# Patient Record
Sex: Male | Born: 1975 | Race: White | Hispanic: No | Marital: Married | Smoking: Never smoker
Health system: Southern US, Community
[De-identification: ages and names within clinical notes are randomized; demographics above are authoritative.]

---

## 1999-02-21 ENCOUNTER — Emergency Department (HOSPITAL_COMMUNITY): Admission: EM | Admit: 1999-02-21 | Discharge: 1999-02-21 | Payer: Self-pay | Admitting: Emergency Medicine

## 2008-09-30 ENCOUNTER — Emergency Department (HOSPITAL_BASED_OUTPATIENT_CLINIC_OR_DEPARTMENT_OTHER): Admission: EM | Admit: 2008-09-30 | Discharge: 2008-09-30 | Payer: Self-pay | Admitting: Emergency Medicine

## 2011-01-12 LAB — RAPID STREP SCREEN (MED CTR MEBANE ONLY): Streptococcus, Group A Screen (Direct): NEGATIVE

## 2020-12-04 ENCOUNTER — Other Ambulatory Visit: Payer: Self-pay

## 2020-12-04 ENCOUNTER — Ambulatory Visit (INDEPENDENT_AMBULATORY_CARE_PROVIDER_SITE_OTHER): Payer: Managed Care, Other (non HMO) | Admitting: Family Medicine

## 2020-12-04 DIAGNOSIS — M25511 Pain in right shoulder: Secondary | ICD-10-CM | POA: Diagnosis not present

## 2020-12-04 NOTE — Progress Notes (Signed)
   Office Visit Note   Patient: Carlos Simmons           Date of Birth: 1976/09/07           MRN: [id number] Visit Date: 12/04/2020 Requested by: No referring provider defined for this encounter. PCP: No primary care provider on file.  Subjective: Chief Complaint  Patient presents with  . Right Shoulder - Pain    Mild to moderate pain that is continuous that will go away from time to time, ROM is fine but hurts with certain motions/positions.    HPI: He is here with right shoulder pain.  He is right-hand dominant, does not recall any injury.  About 2 months ago he started noticing intermittent pain in the lateral shoulder with certain movements such as reaching across his body to grab the seatbelt.  Pain is not severe, but since it has not gone away he felt he should come in to be evaluated.  No previous problems with his shoulder.  His work involves a lot of lifting and repetitive activities.  He has not taken medications for this, does not like to take medicines unless he has to.               ROS:   All other systems were reviewed and are negative.  Objective: Vital Signs: There were no vitals taken for this visit.  Physical Exam:  General:  Alert and oriented, in no acute distress. Pulm:  Breathing unlabored. Psy:  Normal mood, congruent affect.  Right shoulder: He has full active range of motion with no adhesive capsulitis.  He has no tenderness over the [facility] joint but he does have pain with AC crossover test although does not seem to localize to the [facility] joint.  Isometric rotator cuff strength is 5/5 throughout.  He has mild pain with external rotation against resistance.  There is pain with passive abduction and external rotation as well as some crepitus in the shoulder.   Imaging: No results found.  Assessment & Plan: 1.  Right shoulder pain, suspect impingement -Discussed options with him and elected to try home exercises.  If he fails to improve we could add  anti-inflammatories and possibly physical therapy versus a one-time subacromial injection.     Procedures: No procedures performed        PMFS History: There are no problems to display for this patient.  No past medical history on file.  No family history on file.   Social History   Occupational History  . Not on file  Tobacco Use  . Smoking status: Not on file  . Smokeless tobacco: Not on file  Substance and Sexual Activity  . Alcohol use: Not on file  . Drug use: Not on file  . Sexual activity: Not on file

## 2021-02-20 ENCOUNTER — Emergency Department (HOSPITAL_BASED_OUTPATIENT_CLINIC_OR_DEPARTMENT_OTHER): Payer: Managed Care, Other (non HMO)

## 2021-02-20 ENCOUNTER — Emergency Department (HOSPITAL_BASED_OUTPATIENT_CLINIC_OR_DEPARTMENT_OTHER)
Admission: EM | Admit: 2021-02-20 | Discharge: 2021-02-20 | Disposition: A | Discharge status: Home / Self Care | Payer: Managed Care, Other (non HMO) | Attending: Emergency Medicine | Admitting: Emergency Medicine

## 2021-02-20 ENCOUNTER — Other Ambulatory Visit: Payer: Self-pay

## 2021-02-20 ENCOUNTER — Encounter (HOSPITAL_BASED_OUTPATIENT_CLINIC_OR_DEPARTMENT_OTHER): Payer: Self-pay | Admitting: *Deleted

## 2021-02-20 DIAGNOSIS — F17220 Nicotine dependence, chewing tobacco, uncomplicated: Secondary | ICD-10-CM | POA: Insufficient documentation

## 2021-02-20 DIAGNOSIS — R002 Palpitations: Secondary | ICD-10-CM

## 2021-02-20 LAB — COMPREHENSIVE METABOLIC PANEL
ALT: 107 U/L — ABNORMAL HIGH (ref 0–44)
AST: 60 U/L — ABNORMAL HIGH (ref 15–41)
Albumin: 4.2 g/dL (ref 3.5–5.0)
Alkaline Phosphatase: 64 U/L (ref 38–126)
Anion gap: 7 (ref 5–15)
BUN: 14 mg/dL (ref 6–20)
CO2: 25 mmol/L (ref 22–32)
Calcium: 8.4 mg/dL — ABNORMAL LOW (ref 8.9–10.3)
Chloride: 101 mmol/L (ref 98–111)
Creatinine, Ser: 0.87 mg/dL (ref 0.61–1.24)
GFR, Estimated: >60 mL/min (ref >60)
Glucose, Bld: 106 mg/dL — ABNORMAL HIGH (ref 70–99)
Potassium: 3.5 mmol/L (ref 3.5–5.1)
Sodium: 133 mmol/L — ABNORMAL LOW (ref 135–145)
Total Bilirubin: 0.8 mg/dL (ref 0.3–1.2)
Total Protein: 7.7 g/dL (ref 6.5–8.1)

## 2021-02-20 LAB — CBC WITH DIFFERENTIAL/PLATELET
Abs Immature Granulocytes: 0.05 10*3/uL (ref 0.00–0.07)
Basophils Absolute: 0.1 10*3/uL (ref 0.0–0.1)
Basophils Relative: 1 %
Eosinophils Absolute: 0.1 10*3/uL (ref 0.0–0.5)
Eosinophils Relative: 1 %
Hemoglobin: 17.1 g/dL — ABNORMAL HIGH (ref 13.0–17.0)
Immature Granulocytes: 0 %
Lymphocytes Relative: 18 %
Lymphs Abs: 2.1 10*3/uL (ref 0.7–4.0)
Monocytes Absolute: 1 10*3/uL (ref 0.1–1.0)
Monocytes Relative: 9 %
Neutro Abs: 8.3 10*3/uL — ABNORMAL HIGH (ref 1.7–7.7)
Neutrophils Relative %: 71 %
Platelets: 248 10*3/uL (ref 150–400)
Smear Review: NORMAL
WBC: 11.6 10*3/uL — ABNORMAL HIGH (ref 4.0–10.5)
nRBC: 0 % (ref 0.0–0.2)

## 2021-02-20 LAB — TROPONIN I (HIGH SENSITIVITY)
Troponin I (High Sensitivity): <2 ng/L (ref <18)
Troponin I (High Sensitivity): 2 ng/L (ref <18)

## 2021-02-20 MED ORDER — SODIUM CHLORIDE 0.9 % IV BOLUS
1000.0000 mL | Freq: Once | INTRAVENOUS | Status: AC
  Administered 2021-02-20: 1000 mL via INTRAVENOUS

## 2021-02-20 NOTE — Discharge Instructions (Signed)
Your work-up in the ED is reassuring.  Your heart enzyme tests in particular were normal  If you have persistent palpitations, follow-up with a heart doctor.  You may need a heart monitor if you have persistent palpitations  Return to ER if you have worse palpitation, chest pain, trouble breathing

## 2021-02-20 NOTE — ED Notes (Signed)
Patient transported to X-ray 

## 2021-02-20 NOTE — ED Provider Notes (Signed)
[facility] EMERGENCY DEPARTMENT Provider Note   CSN: [id number] Arrival date & time: 02/20/21  1822     History Chief Complaint  Patient presents with  . Palpitations    Carlos Simmons is a 45 y.o. male who presented with palpitations.  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  He states that lasted about 10 minutes.  He denies any chest pressure.  He states that it went away by itself.  Denies any blood clots.  He states that he feels shaky at that time.  Denies any heart disease or arrhythmias.   The history is provided by the patient.       History reviewed. No pertinent past medical history.  There are no problems to display for this patient.   History reviewed. No pertinent surgical history.     No family history on file.  Social History   Tobacco Use  . Smoking status: Never Smoker  . Smokeless tobacco: Current User    Types: Snuff  Substance Use Topics  . Alcohol use: Never  . Drug use: Never    Home Medications Prior to Admission medications   Not on File    Allergies    Patient has no known allergies.  Review of Systems   Review of Systems  Cardiovascular: Positive for palpitations.  All other systems reviewed and are negative.   Physical Exam Updated Vital Signs BP 116/83   Pulse 85   Temp 98.8 F (37.1 C) (Oral)   Resp 17   Ht 5\' 11"  (1.803 m)   Wt 95.3 kg   SpO2 91%   BMI 29.29 kg/m   Physical Exam Vitals and nursing note reviewed.  Constitutional:      Appearance: Normal appearance.  HENT:     Head: Normocephalic.     Mouth/Throat:     Mouth: Mucous membranes are moist.  Eyes:     Extraocular Movements: Extraocular movements intact.     Pupils: Pupils are equal, round, and reactive to light.  Cardiovascular:     Rate and Rhythm: Normal rate and regular rhythm.     Pulses: Normal pulses.     Heart sounds: Normal heart sounds.  Pulmonary:      Effort: Pulmonary effort is normal.     Breath sounds: Normal breath sounds.  Abdominal:     General: Abdomen is flat.     Palpations: Abdomen is soft.  Musculoskeletal:        General: Normal range of motion.     Cervical back: Normal range of motion and neck supple.  Skin:    General: Skin is warm.     Capillary Refill: Capillary refill takes less than 2 seconds.  Neurological:     General: No focal deficit present.     Mental Status: He is alert.  Psychiatric:        Mood and Affect: Mood normal.        Behavior: Behavior normal.     ED Results / Procedures / Treatments   Labs (all labs ordered are listed, but only abnormal results are displayed) Labs Reviewed  CBC WITH DIFFERENTIAL/PLATELET - Abnormal; Notable for the following components:      Result Value   WBC 11.6 (*)    Hemoglobin 17.1 (*)    Neutro Abs 8.3 (*)    All other components within normal limits  COMPREHENSIVE METABOLIC PANEL - Abnormal; Notable for the following components:  Sodium 133 (*)    Glucose, Bld 106 (*)    Calcium 8.4 (*)    AST 60 (*)    ALT 107 (*)    All other components within normal limits  TROPONIN I (HIGH SENSITIVITY)  TROPONIN I (HIGH SENSITIVITY)    EKG EKG Interpretation  Date/Time:  Thursday Feb 20 2021 18:30:53 EDT Ventricular Rate:  87 PR Interval:  130 QRS Duration: 92 QT Interval:  360 QTC Calculation: 433 R Axis:   74 Text Interpretation: Normal sinus rhythm Normal ECG No significant change since last tracing Confirmed by Richardean Canal [phone]) on 02/20/2021 6:56:10 PM   Radiology DG Chest 2 View  Result Date: 02/20/2021 CLINICAL DATA:  Palpitation EXAM: CHEST - 2 VIEW COMPARISON:  None. FINDINGS: The heart size and mediastinal contours are within normal limits. Both lungs are clear. The visualized skeletal structures are unremarkable. IMPRESSION: No active cardiopulmonary disease. Electronically Signed   By: Jasmine Pang M.D.   On: 02/20/2021 19:18     Procedures Procedures   Medications Ordered in ED Medications  sodium chloride 0.9 % bolus 1,000 mL (0 mLs Intravenous Stopped 02/20/21 2125)    ED Course  I have reviewed the triage vital signs and the nursing notes.  Pertinent labs & imaging results that were available during my care of the patient were reviewed by me and considered in my medical decision making (see chart for details).    MDM Rules/Calculators/A&P                         Nichlos Kunzler is a 45 y.o. male here presenting with palpitations.  Palpitations for about 10 minutes today.  Consider dehydration causing palpitations versus arrhythmias.  Patient is in sinus rhythm right now.  Plan to check CBC and CMP and troponin and chest x-ray.  Will hydrate and reassess  10:15 PM Patient's troponin is negative x2.  Will refer to cardiology for Holter monitor if he has persistent palpitations.  Stable for discharge.  Final Clinical Impression(s) / ED Diagnoses Final diagnoses:  None    Rx / DC Orders ED Discharge Orders    None       Charlynne Pander, MD 02/20/21 2216

## 2021-02-20 NOTE — ED Notes (Signed)
Denies CP or shortness of breath.  Just felt like heart was racing.

## 2021-02-20 NOTE — ED Triage Notes (Signed)
Palpitations for about an hour. No pain. He feels shaky. He had soreness in his left arm during the palpitations.

## 2021-08-20 ENCOUNTER — Other Ambulatory Visit: Payer: Self-pay

## 2021-08-20 ENCOUNTER — Encounter: Payer: Self-pay | Admitting: Cardiology

## 2021-08-20 ENCOUNTER — Ambulatory Visit (INDEPENDENT_AMBULATORY_CARE_PROVIDER_SITE_OTHER): Payer: 59

## 2021-08-20 ENCOUNTER — Ambulatory Visit: Payer: 59 | Admitting: Cardiology

## 2021-08-20 VITALS — BP 130/80 | HR 80 | Ht 71.0 in | Wt 222.2 lb

## 2021-08-20 DIAGNOSIS — R002 Palpitations: Secondary | ICD-10-CM | POA: Diagnosis not present

## 2021-08-20 DIAGNOSIS — Z79899 Other long term (current) drug therapy: Secondary | ICD-10-CM

## 2021-08-20 DIAGNOSIS — E669 Obesity, unspecified: Secondary | ICD-10-CM

## 2021-08-20 DIAGNOSIS — Z719 Counseling, unspecified: Secondary | ICD-10-CM | POA: Diagnosis not present

## 2021-08-20 NOTE — Progress Notes (Signed)
Cardiology Office Note:    Date:  08/22/2021   ID:  Carlos Simmons, DOB 08/03/1976, MRN [id number]  PCP:  Pcp, No  Cardiologist:  Thomasene Ripple, DO  Electrophysiologist:  None   Referring MD: No ref. provider found   " My heart keeps fluttering at times"  History of Present Illness:    Carlos Simmons is a 45 y.o. male with no reported medical history who presents to be evaluated for palpitations.  The patient tells me that recently he has had some intermittent palpitations.  He describes it at times as a fluttering sensation.  Sometimes his abrupt heartbeat which cannot last for few minutes and resolves.  It comes and goes.  Back in May the patient had had these episodes and they were frequent.  He ended up at the emergency department for evaluation of this.  At which time his troponins were negative the palpitations had resolved and his work-up was essentially negative.  He tells me he was noted to be dehydrated and was also hyponatremic.  He was asked to follow-up with cardiology.  Today he notes that the episodes are no longer frequent but he still experiences them.  No past medical history on file.  No past surgical history on file.  Current Medications: Current Meds  Medication Sig   aspirin 325 MG tablet Take 325 mg by mouth daily.     Allergies:   Patient has no known allergies.   Social History   Socioeconomic History   Marital status: Married    Spouse name: Not on file   Number of children: Not on file   Years of education: Not on file   Highest education level: Not on file  Occupational History   Not on file  Tobacco Use   Smoking status: Never   Smokeless tobacco: Current    Types: Snuff  Substance and Sexual Activity   Alcohol use: Never   Drug use: Never   Sexual activity: Not on file  Other Topics Concern   Not on file  Social History Narrative   Not on file   Social Determinants of Health   Financial Resource Strain: Not on file  Food  Insecurity: Not on file  Transportation Needs: Not on file  Physical Activity: Not on file  Stress: Not on file  Social Connections: Not on file     Family History: The patient's family history is not on file.  ROS:   Review of Systems  Constitution: Negative for decreased appetite, fever and weight gain.  HENT: Negative for congestion, ear discharge, hoarse voice and sore throat.   Eyes: Negative for discharge, redness, vision loss in right eye and visual halos.  Cardiovascular: Reports palpitations.  Negative for chest pain, dyspnea on exertion, leg swelling, orthopnea.  Respiratory: Negative for cough, hemoptysis, shortness of breath and snoring.   Endocrine: Negative for heat intolerance and polyphagia.  Hematologic/Lymphatic: Negative for bleeding problem. Does not bruise/bleed easily.  Skin: Negative for flushing, nail changes, rash and suspicious lesions.  Musculoskeletal: Negative for arthritis, joint pain, muscle cramps, myalgias, neck pain and stiffness.  Gastrointestinal: Negative for abdominal pain, bowel incontinence, diarrhea and excessive appetite.  Genitourinary: Negative for decreased libido, genital sores and incomplete emptying.  Neurological: Negative for brief paralysis, focal weakness, headaches and loss of balance.  Psychiatric/Behavioral: Negative for altered mental status, depression and suicidal ideas.  Allergic/Immunologic: Negative for HIV exposure and persistent infections.    EKGs/Labs/Other Studies Reviewed:    The following studies were reviewed  today:   EKG:  The ekg ordered today demonstrates sinus rhythm, heart rate 80 bpm.  Recent Labs: 02/20/2021: ALT 107 08/20/2021: BUN 12; Creatinine, Ser 0.92; Hemoglobin 17.2; Magnesium 2.1; Platelets 273; Potassium 4.3; Sodium 139; TSH 1.200  Recent Lipid Panel No results found for: CHOL, TRIG, HDL, CHOLHDL, VLDL, LDLCALC, LDLDIRECT  Physical Exam:    VS:  BP 130/80 (BP Location: Left Arm)   Pulse 80    Ht 5\' 11"  (1.803 m)   Wt 222 lb 3.2 oz (100.8 kg)   SpO2 98%   BMI 30.99 kg/m     Wt Readings from Last 3 Encounters:  08/20/21 222 lb 3.2 oz (100.8 kg)  02/20/21 210 lb (95.3 kg)     GEN: Well nourished, well developed in no acute distress HEENT: Normal NECK: No JVD; No carotid bruits LYMPHATICS: No lymphadenopathy CARDIAC: S1S2 noted,RRR, no murmurs, rubs, gallops RESPIRATORY:  Clear to auscultation without rales, wheezing or rhonchi  ABDOMEN: Soft, non-tender, non-distended, +bowel sounds, no guarding. EXTREMITIES: No edema, No cyanosis, no clubbing MUSCULOSKELETAL:  No deformity  SKIN: Warm and dry NEUROLOGIC:  Alert and oriented x 3, non-focal PSYCHIATRIC:  Normal affect, good insight  ASSESSMENT:    1. Palpitations   2. Health education/counseling   3. Obesity (BMI 30-39.9)    PLAN:     I would like to rule out a cardiovascular etiology of this palpitation, therefore at this time I would like to placed a zio patch for  14 days.  We will also get blood work for TSH, repeat BMP and mag. Once these testing have been performed amd reviewed further reccomendations will be made. For now, I do reccomend that the patient goes to the nearest ED if  symptoms recur.  Overall health education performed.  Risk factors for cardiovascular disease also assessed.  The patient understands the need to lose weight with diet and exercise. We have discussed specific strategies for this.   The patient is in agreement with the above plan. The patient left the office in stable condition.  The patient will follow up as needed if monitor is normal.   Medication Adjustments/Labs and Tests Ordered: Current medicines are reviewed at length with the patient today.  Concerns regarding medicines are outlined above.  Orders Placed This Encounter  Procedures   Basic Metabolic Panel (BMET)   Magnesium   CBC with Differential/Platelet   TSH+T4F+T3Free   LONG TERM MONITOR (3-14 DAYS)   EKG  12-Lead    No orders of the defined types were placed in this encounter.   Patient Instructions  Medication Instructions:  Your physician recommends that you continue on your current medications as directed. Please refer to the Current Medication list given to you today.  *If you need a refill on your cardiac medications before your next appointment, please call your pharmacy*   Lab Work: Your physician recommends that you return for lab work in:  TODAY: BMET, Mag, CBC, [phone] If you have labs (blood work) drawn today and your tests are completely normal, you will receive your results only by: MyChart Message (if you have MyChart) OR A paper copy in the mail If you have any lab test that is abnormal or we need to change your treatment, we will call you to review the results.   Testing/Procedures: Christena Deem- Long Term Monitor Instructions  Your physician has requested you wear a ZIO patch monitor for 14 days.  This is a single patch monitor. [employer] supplies one patch monitor  per enrollment. Additional stickers are not available. Please do not apply patch if you will be having a Nuclear Stress Test,  Echocardiogram, Cardiac CT, MRI, or Chest Xray during the period you would be wearing the  monitor. The patch cannot be worn during these tests. You cannot remove and re-apply the  ZIO XT patch monitor.  Your ZIO patch monitor will be mailed 3 day USPS to your address on file. It may take 3-5 days  to receive your monitor after you have been enrolled.  Once you have received your monitor, please review the enclosed instructions. Your monitor  has already been registered assigning a specific monitor serial # to you.  Billing and Patient Assistance Program Information  We have supplied [employer] with any of your insurance information on file for billing purposes. [employer] offers a sliding scale Patient Assistance Program for patients that do not have  insurance, or whose insurance does  not completely cover the cost of the ZIO monitor.  You must apply for the Patient Assistance Program to qualify for this discounted rate.  To apply, please call [employer] at [phone], select option 4, select option 2, ask to apply for  Patient Assistance Program. Meredeth Ide will ask your household income, and how many people  are in your household. They will quote your out-of-pocket cost based on that information.  [employer] will also be able to set up a 60-month, interest-free payment plan if needed.  Applying the monitor   Shave hair from upper left chest.  Hold abrader disc by orange tab. Rub abrader in 40 strokes over the upper left chest as  indicated in your monitor instructions.  Clean area with 4 enclosed alcohol pads. Let dry.  Apply patch as indicated in monitor instructions. Patch will be placed under collarbone on left  side of chest with arrow pointing upward.  Rub patch adhesive wings for 2 minutes. Remove white label marked "1". Remove the white  label marked "2". Rub patch adhesive wings for 2 additional minutes.  While looking in a mirror, press and release button in center of patch. A small green light will  flash 3-4 times. This will be your only indicator that the monitor has been turned on.  Do not shower for the first 24 hours. You may shower after the first 24 hours.  Press the button if you feel a symptom. You will hear a small click. Record Date, Time and  Symptom in the Patient Logbook.  When you are ready to remove the patch, follow instructions on the last 2 pages of Patient  Logbook. Stick patch monitor onto the last page of Patient Logbook.  Place Patient Logbook in the blue and white box. Use locking tab on box and tape box closed  securely. The blue and white box has prepaid postage on it. Please place it in the mailbox as  soon as possible. Your physician should have your test results approximately 7 days after the  monitor has been mailed back to [facility].   Call [facility] Customer Care at [phone] if you have questions regarding  your ZIO XT patch monitor. Call them immediately if you see an orange light blinking on your  monitor.  If your monitor falls off in less than 4 days, contact our Monitor department at [phone].  If your monitor becomes loose or falls off after 4 days call [employer] at [phone] for  suggestions on securing your monitor   Follow-Up: At [facility], you and your health needs  are our priority.  As part of our continuing mission to provide you with exceptional heart care, we have created designated Provider Care Teams.  These Care Teams include your primary Cardiologist (physician) and Advanced Practice Providers (APPs -  Physician Assistants and Nurse Practitioners) who all work together to provide you with the care you need, when you need it.  We recommend signing up for the patient portal called "MyChart".  Sign up information is provided on this After Visit Summary.  MyChart is used to connect with patients for Virtual Visits (Telemedicine).  Patients are able to view lab/test results, encounter notes, upcoming appointments, etc.  Non-urgent messages can be sent to your provider as well.   To learn more about what you can do with MyChart, go to [email or web address].    Your next appointment:    As needed if monitor is normal  The format for your next appointment:   In Person  Provider:   Thomasene Ripple, DO     Other Instructions     Adopting a Healthy Lifestyle.  Know what a healthy weight is for you (roughly BMI <25) and aim to maintain this   Aim for 7+ servings of fruits and vegetables daily   65-80+ fluid ounces of water or unsweet tea for healthy kidneys   Limit to max 1 drink of alcohol per day; avoid smoking/tobacco   Limit animal fats in diet for cholesterol and heart health - choose grass fed whenever available   Avoid highly processed foods, and foods high in  saturated/trans fats   Aim for low stress - take time to unwind and care for your mental health   Aim for 150 min of moderate intensity exercise weekly for heart health, and weights twice weekly for bone health   Aim for 7-9 hours of sleep daily   When it comes to diets, agreement about the perfect plan isnt easy to find, even among the experts. Experts at the [facility] [employer] developed an idea known as the Healthy Eating Plate. Just imagine a plate divided into logical, healthy portions.   The emphasis is on diet quality:   Load up on vegetables and fruits - one-half of your plate: Aim for color and variety, and remember that potatoes dont count.   Go for whole grains - one-quarter of your plate: Whole wheat, barley, wheat berries, quinoa, oats, brown rice, and foods made with them. If you want pasta, go with whole wheat pasta.   Protein power - one-quarter of your plate: Fish, chicken, beans, and nuts are all healthy, versatile protein sources. Limit red meat.   The diet, however, does go beyond the plate, offering a few other suggestions.   Use healthy plant oils, such as olive, canola, soy, corn, sunflower and peanut. Check the labels, and avoid partially hydrogenated oil, which have unhealthy trans fats.   If youre thirsty, drink water. Coffee and tea are good in moderation, but skip sugary drinks and limit milk and dairy products to one or two daily servings.   The type of carbohydrate in the diet is more important than the amount. Some sources of carbohydrates, such as vegetables, fruits, whole grains, and beans-are healthier than others.   Finally, stay active  Signed, Thomasene Ripple, DO  08/22/2021 10:43 AM    [facility]

## 2021-08-20 NOTE — Patient Instructions (Signed)
Medication Instructions:  Your physician recommends that you continue on your current medications as directed. Please refer to the Current Medication list given to you today.  *If you need a refill on your cardiac medications before your next appointment, please call your pharmacy*   Lab Work: Your physician recommends that you return for lab work in:  TODAY: BMET, Mag, CBC, [phone] If you have labs (blood work) drawn today and your tests are completely normal, you will receive your results only by: MyChart Message (if you have MyChart) OR A paper copy in the mail If you have any lab test that is abnormal or we need to change your treatment, we will call you to review the results.   Testing/Procedures: Christena Deem- Long Term Monitor Instructions  Your physician has requested you wear a ZIO patch monitor for 14 days.  This is a single patch monitor. [employer] supplies one patch monitor per enrollment. Additional stickers are not available. Please do not apply patch if you will be having a Nuclear Stress Test,  Echocardiogram, Cardiac CT, MRI, or Chest Xray during the period you would be wearing the  monitor. The patch cannot be worn during these tests. You cannot remove and re-apply the  ZIO XT patch monitor.  Your ZIO patch monitor will be mailed 3 day USPS to your address on file. It may take 3-5 days  to receive your monitor after you have been enrolled.  Once you have received your monitor, please review the enclosed instructions. Your monitor  has already been registered assigning a specific monitor serial # to you.  Billing and Patient Assistance Program Information  We have supplied [employer] with any of your insurance information on file for billing purposes. [employer] offers a sliding scale Patient Assistance Program for patients that do not have  insurance, or whose insurance does not completely cover the cost of the ZIO monitor.  You must apply for the Patient Assistance Program to  qualify for this discounted rate.  To apply, please call [employer] at [phone], select option 4, select option 2, ask to apply for  Patient Assistance Program. Meredeth Ide will ask your household income, and how many people  are in your household. They will quote your out-of-pocket cost based on that information.  [employer] will also be able to set up a 48-month, interest-free payment plan if needed.  Applying the monitor   Shave hair from upper left chest.  Hold abrader disc by orange tab. Rub abrader in 40 strokes over the upper left chest as  indicated in your monitor instructions.  Clean area with 4 enclosed alcohol pads. Let dry.  Apply patch as indicated in monitor instructions. Patch will be placed under collarbone on left  side of chest with arrow pointing upward.  Rub patch adhesive wings for 2 minutes. Remove white label marked "1". Remove the white  label marked "2". Rub patch adhesive wings for 2 additional minutes.  While looking in a mirror, press and release button in center of patch. A small green light will  flash 3-4 times. This will be your only indicator that the monitor has been turned on.  Do not shower for the first 24 hours. You may shower after the first 24 hours.  Press the button if you feel a symptom. You will hear a small click. Record Date, Time and  Symptom in the Patient Logbook.  When you are ready to remove the patch, follow instructions on the last 2 pages of Patient  Logbook.  Stick patch monitor onto the last page of Patient Logbook.  Place Patient Logbook in the blue and white box. Use locking tab on box and tape box closed  securely. The blue and white box has prepaid postage on it. Please place it in the mailbox as  soon as possible. Your physician should have your test results approximately 7 days after the  monitor has been mailed back to [facility].  Call [facility] Customer Care at [phone] if you have questions regarding  your ZIO XT  patch monitor. Call them immediately if you see an orange light blinking on your  monitor.  If your monitor falls off in less than 4 days, contact our Monitor department at [phone].  If your monitor becomes loose or falls off after 4 days call [employer] at [phone] for  suggestions on securing your monitor   Follow-Up: At [facility], you and your health needs are our priority.  As part of our continuing mission to provide you with exceptional heart care, we have created designated Provider Care Teams.  These Care Teams include your primary Cardiologist (physician) and Advanced Practice Providers (APPs -  Physician Assistants and Nurse Practitioners) who all work together to provide you with the care you need, when you need it.  We recommend signing up for the patient portal called "MyChart".  Sign up information is provided on this After Visit Summary.  MyChart is used to connect with patients for Virtual Visits (Telemedicine).  Patients are able to view lab/test results, encounter notes, upcoming appointments, etc.  Non-urgent messages can be sent to your provider as well.   To learn more about what you can do with MyChart, go to [email or web address].    Your next appointment:    As needed if monitor is normal  The format for your next appointment:   In Person  Provider:   Thomasene Ripple, DO     Other Instructions

## 2021-08-21 LAB — TSH+T4F+T3FREE
Free T4: 1.26 ng/dL (ref 0.82–1.77)
T3, Free: 3.3 pg/mL (ref 2.0–4.4)
TSH: 1.2 u[IU]/mL (ref 0.450–4.500)

## 2021-08-21 LAB — CBC WITH DIFFERENTIAL/PLATELET
Basophils Absolute: 0.1 10*3/uL (ref 0.0–0.2)
Basos: 1 %
EOS (ABSOLUTE): 0.1 10*3/uL (ref 0.0–0.4)
Eos: 1 %
Hematocrit: 46.2 % (ref 37.5–51.0)
Hemoglobin: 17.2 g/dL (ref 13.0–17.7)
Immature Grans (Abs): 0 10*3/uL (ref 0.0–0.1)
Immature Granulocytes: 0 %
Lymphocytes Absolute: 1.6 10*3/uL (ref 0.7–3.1)
Lymphs: 25 %
MCH: 35.2 pg — ABNORMAL HIGH (ref 26.6–33.0)
MCHC: 37.2 g/dL — ABNORMAL HIGH (ref 31.5–35.7)
MCV: 95 fL (ref 79–97)
Monocytes Absolute: 0.6 10*3/uL (ref 0.1–0.9)
Monocytes: 10 %
Neutrophils Absolute: 4.1 10*3/uL (ref 1.4–7.0)
Neutrophils: 63 %
Platelets: 273 10*3/uL (ref 150–450)
RBC: 4.88 x10E6/uL (ref 4.14–5.80)
RDW: 11.1 % — ABNORMAL LOW (ref 11.6–15.4)
WBC: 6.4 10*3/uL (ref 3.4–10.8)

## 2021-08-21 LAB — BASIC METABOLIC PANEL
BUN/Creatinine Ratio: 13 (ref 9–20)
BUN: 12 mg/dL (ref 6–24)
CO2: 23 mmol/L (ref 20–29)
Calcium: 9.5 mg/dL (ref 8.7–10.2)
Chloride: 101 mmol/L (ref 96–106)
Creatinine, Ser: 0.92 mg/dL (ref 0.76–1.27)
Glucose: 93 mg/dL (ref 70–99)
Potassium: 4.3 mmol/L (ref 3.5–5.2)
Sodium: 139 mmol/L (ref 134–144)
eGFR: 106 mL/min/{1.73_m2} (ref >59)

## 2021-08-21 LAB — MAGNESIUM: Magnesium: 2.1 mg/dL (ref 1.6–2.3)

## 2021-08-25 ENCOUNTER — Telehealth: Payer: Self-pay

## 2021-08-25 NOTE — Telephone Encounter (Signed)
Called pt in regards to him not needing to take an Aspirin daily. Dr. Servando Salina recommends alternative pain relieve medications instead of Aspirin. Pt states "I do not take medications for pain so I will stop taking the Aspirin as together." Will update pt's medications list.

## 2022-01-05 ENCOUNTER — Other Ambulatory Visit: Payer: Self-pay

## 2022-01-05 ENCOUNTER — Emergency Department (HOSPITAL_BASED_OUTPATIENT_CLINIC_OR_DEPARTMENT_OTHER)
Admission: EM | Admit: 2022-01-05 | Discharge: 2022-01-05 | Disposition: A | Discharge status: Home / Self Care | Payer: 59 | Attending: Emergency Medicine | Admitting: Emergency Medicine

## 2022-01-05 ENCOUNTER — Encounter (HOSPITAL_BASED_OUTPATIENT_CLINIC_OR_DEPARTMENT_OTHER): Payer: Self-pay | Admitting: Emergency Medicine

## 2022-01-05 ENCOUNTER — Emergency Department (HOSPITAL_BASED_OUTPATIENT_CLINIC_OR_DEPARTMENT_OTHER): Payer: 59

## 2022-01-05 DIAGNOSIS — F17210 Nicotine dependence, cigarettes, uncomplicated: Secondary | ICD-10-CM | POA: Diagnosis not present

## 2022-01-05 DIAGNOSIS — R072 Precordial pain: Secondary | ICD-10-CM | POA: Diagnosis not present

## 2022-01-05 DIAGNOSIS — R079 Chest pain, unspecified: Secondary | ICD-10-CM | POA: Diagnosis present

## 2022-01-05 DIAGNOSIS — R778 Other specified abnormalities of plasma proteins: Secondary | ICD-10-CM | POA: Diagnosis not present

## 2022-01-05 DIAGNOSIS — Z7982 Long term (current) use of aspirin: Secondary | ICD-10-CM | POA: Insufficient documentation

## 2022-01-05 LAB — BASIC METABOLIC PANEL
Anion gap: 8 (ref 5–15)
BUN: 17 mg/dL (ref 6–20)
CO2: 26 mmol/L (ref 22–32)
Calcium: 9 mg/dL (ref 8.9–10.3)
Chloride: 102 mmol/L (ref 98–111)
Creatinine, Ser: 0.99 mg/dL (ref 0.61–1.24)
GFR, Estimated: >60 mL/min (ref >60)
Glucose, Bld: 100 mg/dL — ABNORMAL HIGH (ref 70–99)
Potassium: 3.8 mmol/L (ref 3.5–5.1)
Sodium: 136 mmol/L (ref 135–145)

## 2022-01-05 LAB — CBC WITH DIFFERENTIAL/PLATELET
Abs Immature Granulocytes: 0.01 10*3/uL (ref 0.00–0.07)
Basophils Absolute: 0 10*3/uL (ref 0.0–0.1)
Basophils Relative: 1 %
Eosinophils Absolute: 0.1 10*3/uL (ref 0.0–0.5)
Eosinophils Relative: 1 %
HCT: 45.1 % (ref 39.0–52.0)
Hemoglobin: 16.9 g/dL (ref 13.0–17.0)
Immature Granulocytes: 0 %
Lymphocytes Relative: 27 %
Lymphs Abs: 1.7 10*3/uL (ref 0.7–4.0)
MCH: 34.8 pg — ABNORMAL HIGH (ref 26.0–34.0)
MCHC: 37.5 g/dL — ABNORMAL HIGH (ref 30.0–36.0)
MCV: 92.8 fL (ref 80.0–100.0)
Monocytes Absolute: 0.7 10*3/uL (ref 0.1–1.0)
Monocytes Relative: 11 %
Neutro Abs: 3.8 10*3/uL (ref 1.7–7.7)
Neutrophils Relative %: 60 %
Platelets: 275 10*3/uL (ref 150–400)
RBC: 4.86 MIL/uL (ref 4.22–5.81)
RDW: 11.9 % (ref 11.5–15.5)
WBC: 6.4 10*3/uL (ref 4.0–10.5)
nRBC: 0 % (ref 0.0–0.2)

## 2022-01-05 LAB — TROPONIN I (HIGH SENSITIVITY): Troponin I (High Sensitivity): <2 ng/L (ref <18)

## 2022-01-05 MED ORDER — ASPIRIN 81 MG PO CHEW
324.0000 mg | CHEWABLE_TABLET | Freq: Once | ORAL | Status: AC
  Administered 2022-01-05: 324 mg via ORAL
  Filled 2022-01-05: qty 4

## 2022-01-05 NOTE — ED Notes (Signed)
ED Provider at bedside. 

## 2022-01-05 NOTE — ED Provider Notes (Signed)
?[facility] EMERGENCY DEPARTMENT ?Provider Note ? ? ?CSN: [id number] ?Arrival date & time: 01/05/22  1353 ? ?  ? ?History ? ?Chief Complaint  ?Patient presents with  ? Chest Pain  ? ? ?Carlos Simmons is a 46 y.o. male with no pertinent past medical history.  Presents emergency department with a chief complaint of chest pain.  Patient states that he has been dealing with intermittent chest pain over the last few months.  Patient states that previously he has had a sharp pain in the middle of his chest that last for a few seconds and then goes away.  Patient states that this morning at approximately 0 200 while watching TV he had a sudden onset of chest pain.  Pain is located to his mid sternum and does not radiate.  Patient described pain as sharp.  Patient stated that pain was 2/10 on the pain scale when starting.  Patient denies any associated nausea, vomiting, diaphoresis, shortness of breath, palpitations, lightheadedness or syncope.  Patient states that after waking this morning at 10 AM chest pain was still there.  Patient reports that pain became worse after he put in chewing tobacco at 1 PM..  Patient reports he is chest pain-free at this time. ? ?Patient denies any leg swelling or tenderness, palpitations, abdominal pain, nausea, vomiting, diaphoresis, shortness of breath, hemoptysis, numbness, weakness, lightheadedness, syncope, history of DVT/PE, surgery in the last 4 weeks, cigarette use, family history of cardiac history before age 57. ? ? ?Chest Pain ?Associated symptoms: no abdominal pain, no back pain, no cough, no dizziness, no fever, no headache, no nausea, no numbness, no palpitations, no shortness of breath, no vomiting and no weakness   ? ?  ? ?Home Medications ?Prior to Admission medications   ?Not on File  ?   ? ?Allergies    ?Patient has no known allergies.   ? ?Review of Systems   ?Review of Systems  ?Constitutional:  Negative for chills and fever.  ?Eyes:  Negative for visual  disturbance.  ?Respiratory:  Negative for cough and shortness of breath.   ?Cardiovascular:  Positive for chest pain. Negative for palpitations and leg swelling.  ?Gastrointestinal:  Negative for abdominal pain, nausea and vomiting.  ?Musculoskeletal:  Negative for back pain and neck pain.  ?Skin:  Negative for color change and rash.  ?Neurological:  Negative for dizziness, syncope, weakness, light-headedness, numbness and headaches.  ?Psychiatric/Behavioral:  Negative for confusion.   ? ?Physical Exam ?Updated Vital Signs ?BP (!) 152/93   Pulse 87   Temp 98.6 ?F (37 ?C) (Oral)   Resp 15   Ht 5\' 11"  (1.803 m)   Wt 95.3 kg   SpO2 100%   BMI 29.29 kg/m?  ?Physical Exam ?Vitals and nursing note reviewed.  ?Constitutional:   ?   General: He is not in acute distress. ?   Appearance: He is not ill-appearing, toxic-appearing or diaphoretic.  ?HENT:  ?   Head: Normocephalic.  ?Eyes:  ?   General: No scleral icterus.    ?   Right eye: No discharge.     ?   Left eye: No discharge.  ?Cardiovascular:  ?   Rate and Rhythm: Normal rate.  ?   Pulses:     ?     Radial pulses are 2+ on the right side and 2+ on the left side.  ?   Heart sounds: Normal heart sounds, S1 normal and S2 normal.  ?Pulmonary:  ?   Effort: Pulmonary  effort is normal. No tachypnea, bradypnea or respiratory distress.  ?   Breath sounds: Normal breath sounds. No stridor.  ?Abdominal:  ?   General: Abdomen is protuberant. There is no distension. There are no signs of injury.  ?   Palpations: Abdomen is soft. There is no mass or pulsatile mass.  ?   Tenderness: There is no abdominal tenderness. There is no guarding or rebound.  ?   Hernia: There is no hernia in the umbilical area or ventral area.  ?Musculoskeletal:  ?   Right lower leg: No edema.  ?   Left lower leg: No edema.  ?Skin: ?   General: Skin is warm and dry.  ?Neurological:  ?   General: No focal deficit present.  ?   Mental Status: He is alert.  ?Psychiatric:     ?   Behavior: Behavior is  cooperative.  ? ? ?ED Results / Procedures / Treatments   ?Labs ?(all labs ordered are listed, but only abnormal results are displayed) ?Labs Reviewed  ?BASIC METABOLIC PANEL - Abnormal; Notable for the following components:  ?    Result Value  ? Glucose, Bld 100 (*)   ? All other components within normal limits  ?CBC WITH DIFFERENTIAL/PLATELET - Abnormal; Notable for the following components:  ? MCH 34.8 (*)   ? MCHC 37.5 (*)   ? All other components within normal limits  ?TROPONIN I (HIGH SENSITIVITY)  ? ? ?EKG ?EKG Interpretation ? ?Date/Time:  Monday January 05 2022 14:01:20 EDT ?Ventricular Rate:  86 ?PR Interval:  124 ?QRS Duration: 90 ?QT Interval:  342 ?QTC Calculation: 409 ?R Axis:   73 ?Text Interpretation: Normal sinus rhythm Nonspecific ST abnormality Abnormal ECG No significant change since last tracing Confirmed by Melene PlanFloyd, Dan [phone]([id number]) on 01/05/2022 2:59:56 PM ? ?Radiology ?DG Chest Portable 1 View ? ?Result Date: 01/05/2022 ?CLINICAL DATA:  Chest pain EXAM: PORTABLE CHEST 1 VIEW COMPARISON:  Chest x-ray 02/20/2021 FINDINGS: Heart size and mediastinal contours are within normal limits. No suspicious pulmonary opacities identified. No pleural effusion or pneumothorax visualized. No acute osseous abnormality appreciated. IMPRESSION: No acute intrathoracic process identified. Electronically Signed   By: Jannifer Hickelaney  Williams M.D.   On: 01/05/2022 15:11   ? ?Procedures ?Procedures  ? ? ?Medications Ordered in ED ?Medications  ?aspirin chewable tablet 324 mg (324 mg Oral Given 01/05/22 1426)  ? ? ?ED Course/ Medical Decision Making/ A&P ?  ?                        ?Medical Decision Making ?Amount and/or Complexity of Data Reviewed ?Labs: ordered. ?Radiology: ordered. ? ?Risk ?OTC drugs. ? ? ?Alert 46 year old male in no acute distress, nontoxic-appearing.  Presents the emergency department with a chief complaint of chest pain. ? ?Information obtained from patient.  Past medical records were reviewed including previous  provider notes, labs, and imaging.   ? ?Due to patient's reports of chest pain will initiate ACS work-up at this time.  Patient given 324 mg of aspirin.   ? ?CTA of chest was considered to evaluate for PE however patient can be [facility]. ? ?I personally viewed interpreted patient's EKG.  Tracing shows sinus rhythm with nonspecific ST changes, tracing similar to previous. ? ?I personally viewed and interpreted patient's chest x-ray.  Imaging shows no active cardiopulmonary disease. ? ?I personally viewed and interpreted patient's lab results.  Pertinent findings include: ?-BMP and CBC unremarkable ?-Troponin less than  2 ? ?Patient has heart score of 1 with troponin less than 2 and chest pain that has been present for more than 6 hours.  Low suspicion for ACS at this time.  We will have patient follow-up with PCP and cardiology. ? ?Based on patient's chief complaint, I considered admission might be necessary, however after reassuring ED workup feel patient is reasonable for discharge.  Discussed results, findings, treatment and follow up. Patient advised of return precautions. Patient verbalized understanding and agreed with plan. ? ?Portions of this note were generated with Scientist, clinical (histocompatibility and immunogenetics). Dictation errors may occur despite best attempts at proofreading. ? ? ? ? ? ? ? ? ?Final Clinical Impression(s) / ED Diagnoses ?Final diagnoses:  ?Precordial chest pain  ? ? ?Rx / DC Orders ?ED Discharge Orders   ? ? None  ? ?  ? ? ?  ?Haskel Schroeder, PA-C ?01/05/22 1518 ? ?  ?Melene Plan, DO ?01/05/22 1529 ? ?

## 2022-01-05 NOTE — ED Triage Notes (Signed)
Pt arrives pov with c/o mid sternal non-radiating CP starting last night. Pt denies n/v  ?

## 2022-01-05 NOTE — Discharge Instructions (Addendum)
You came to the emergency department today to be evaluated for your chest pain.  Your physical exam was reassuring.  Your lab work was reassuring that you are not having acute heart attack today.  Please follow-up with your primary care doctor and cardiologist for repeat evaluation. ? ?Get help right away if: ?Your chest pain gets worse. ?You have a cough that gets worse, or you cough up blood. ?You have severe pain in your abdomen. ?You faint. ?You have sudden, unexplained chest discomfort. ?You have sudden, unexplained discomfort in your arms, back, neck, or jaw. ?You have shortness of breath at any time. ?You suddenly start to sweat, or your skin gets clammy. ?You feel nausea or you vomit. ?You suddenly feel lightheaded or dizzy. ?You have severe weakness, or unexplained weakness or fatigue. ?Your heart begins to beat quickly, or it feels like it is skipping beats. ?

## 2023-02-09 IMAGING — DX DG CHEST 2V
2 series · 2 of 2 positions shown · non-contrast
Comparison: None.

CLINICAL DATA: Palpitation

EXAM:
CHEST - 2 VIEW

[chest pa]
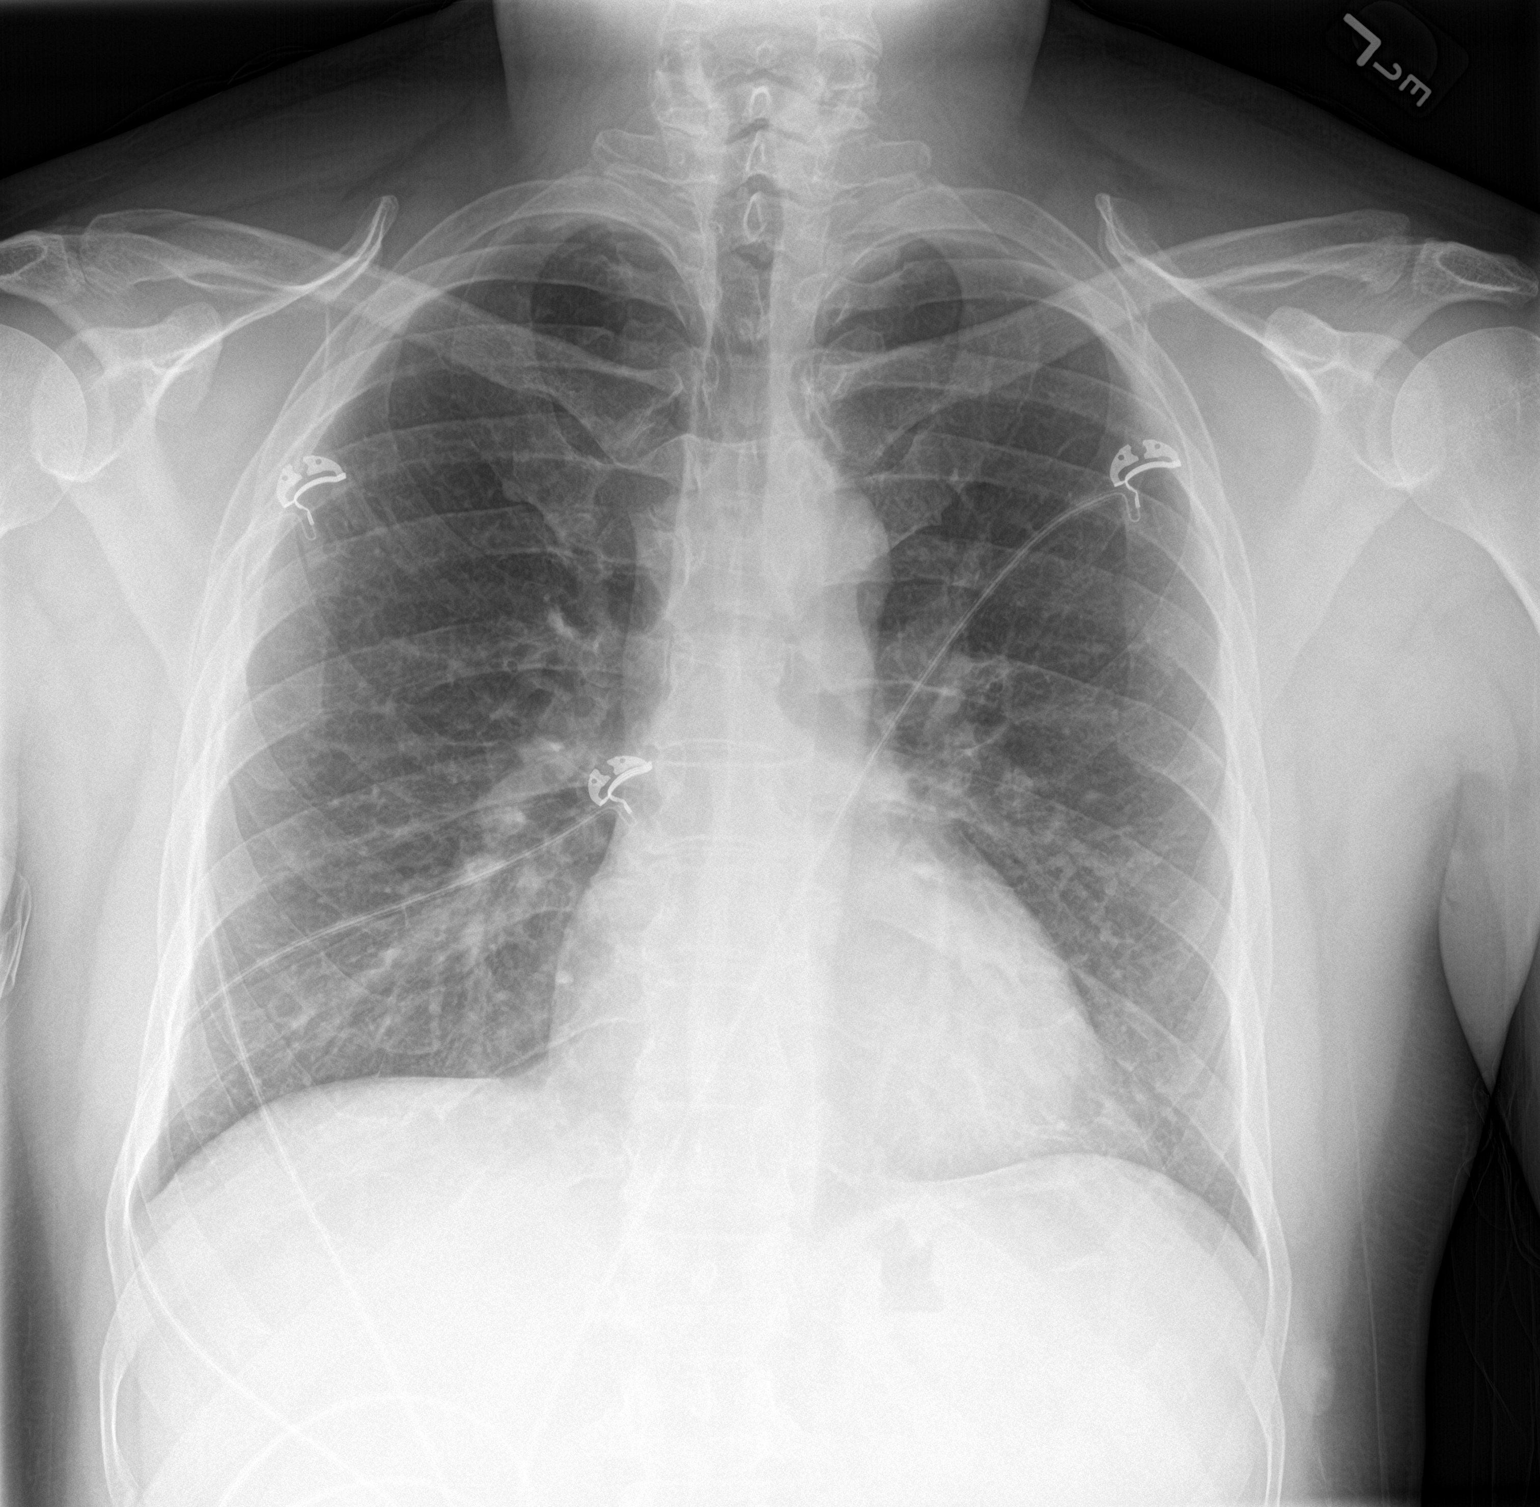

[chest lat]
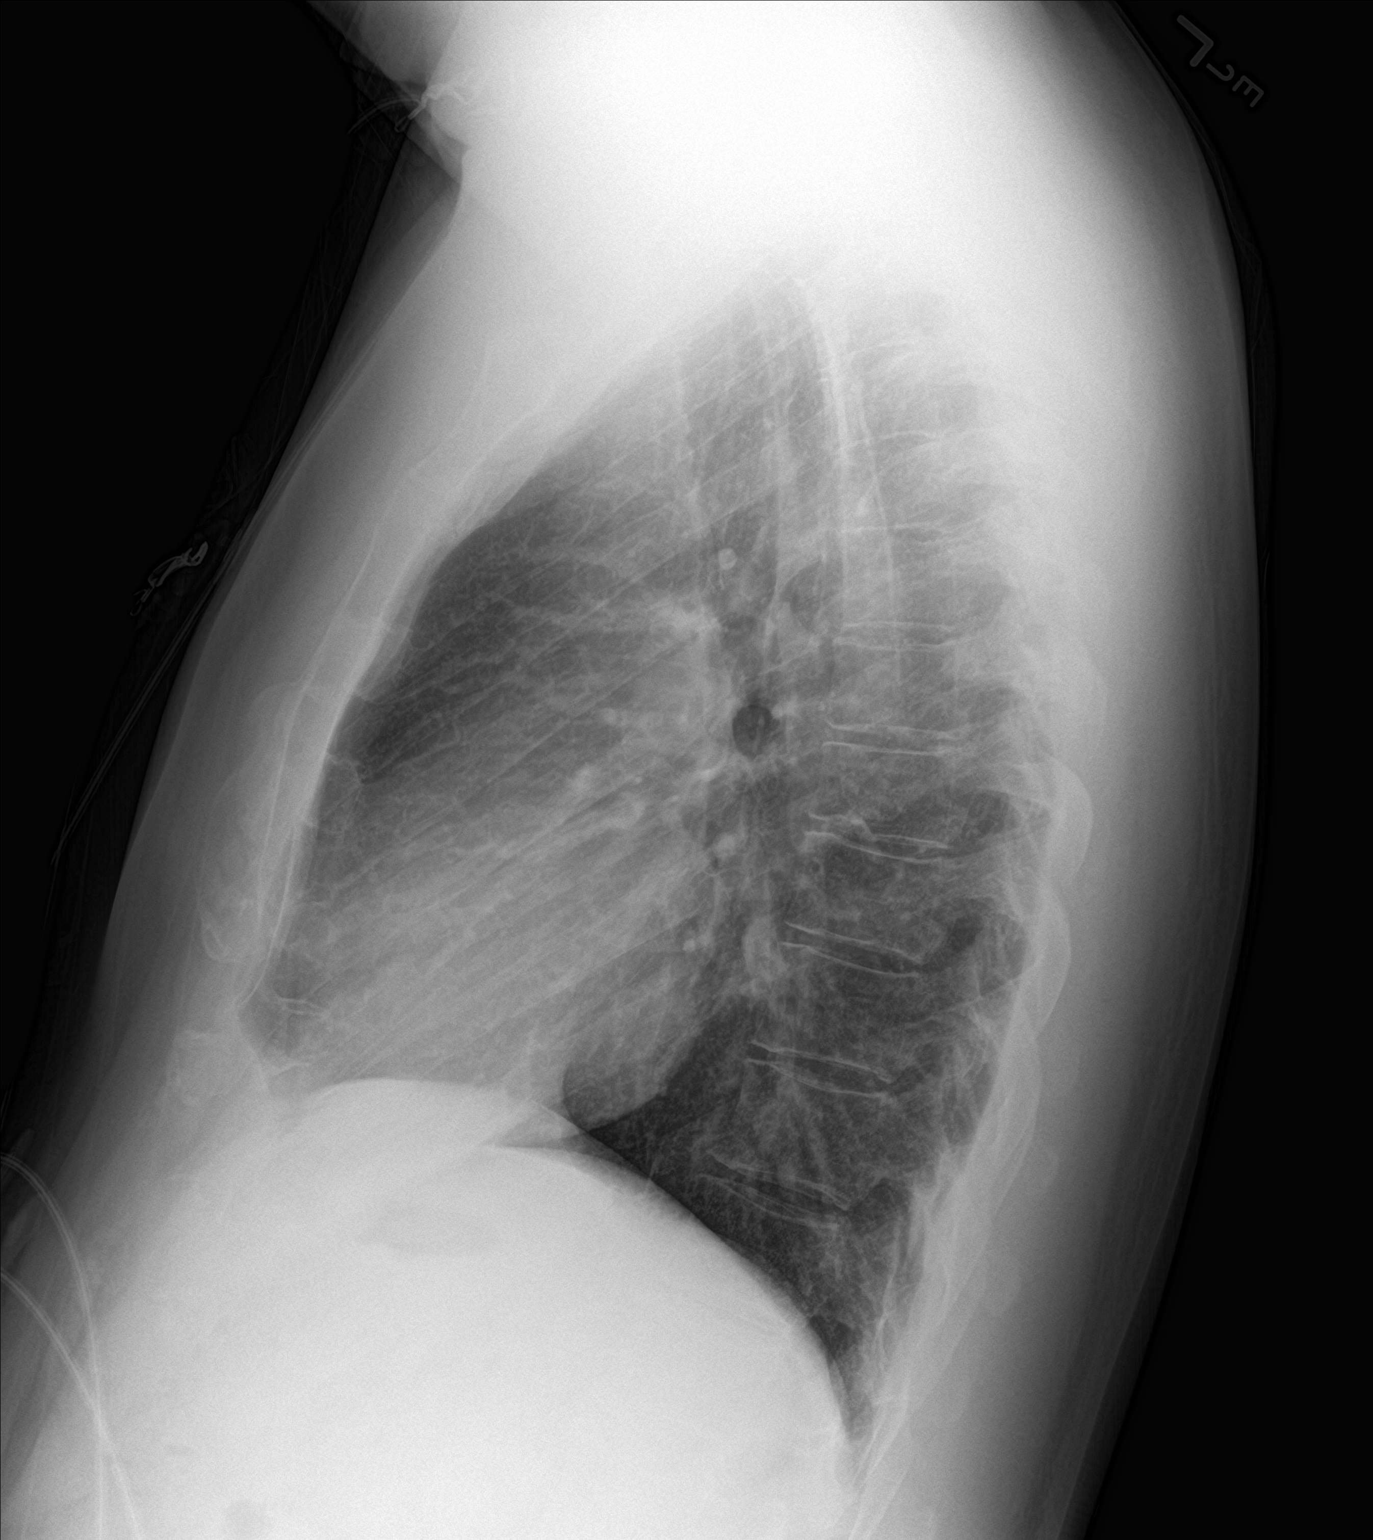

[2 of 2 positions shown; findings below may reference images not displayed]

FINDINGS: The heart size and mediastinal contours are within normal limits.
Both lungs are clear. The visualized skeletal structures are
unremarkable.
IMPRESSION: No active cardiopulmonary disease.

## 2023-12-25 IMAGING — DX DG CHEST 1V PORT
1 series · 1 of 1 positions shown · non-contrast
Comparison: Chest x-ray 02/20/2021

CLINICAL DATA: Chest pain

EXAM:
PORTABLE CHEST 1 VIEW

[chest ap]
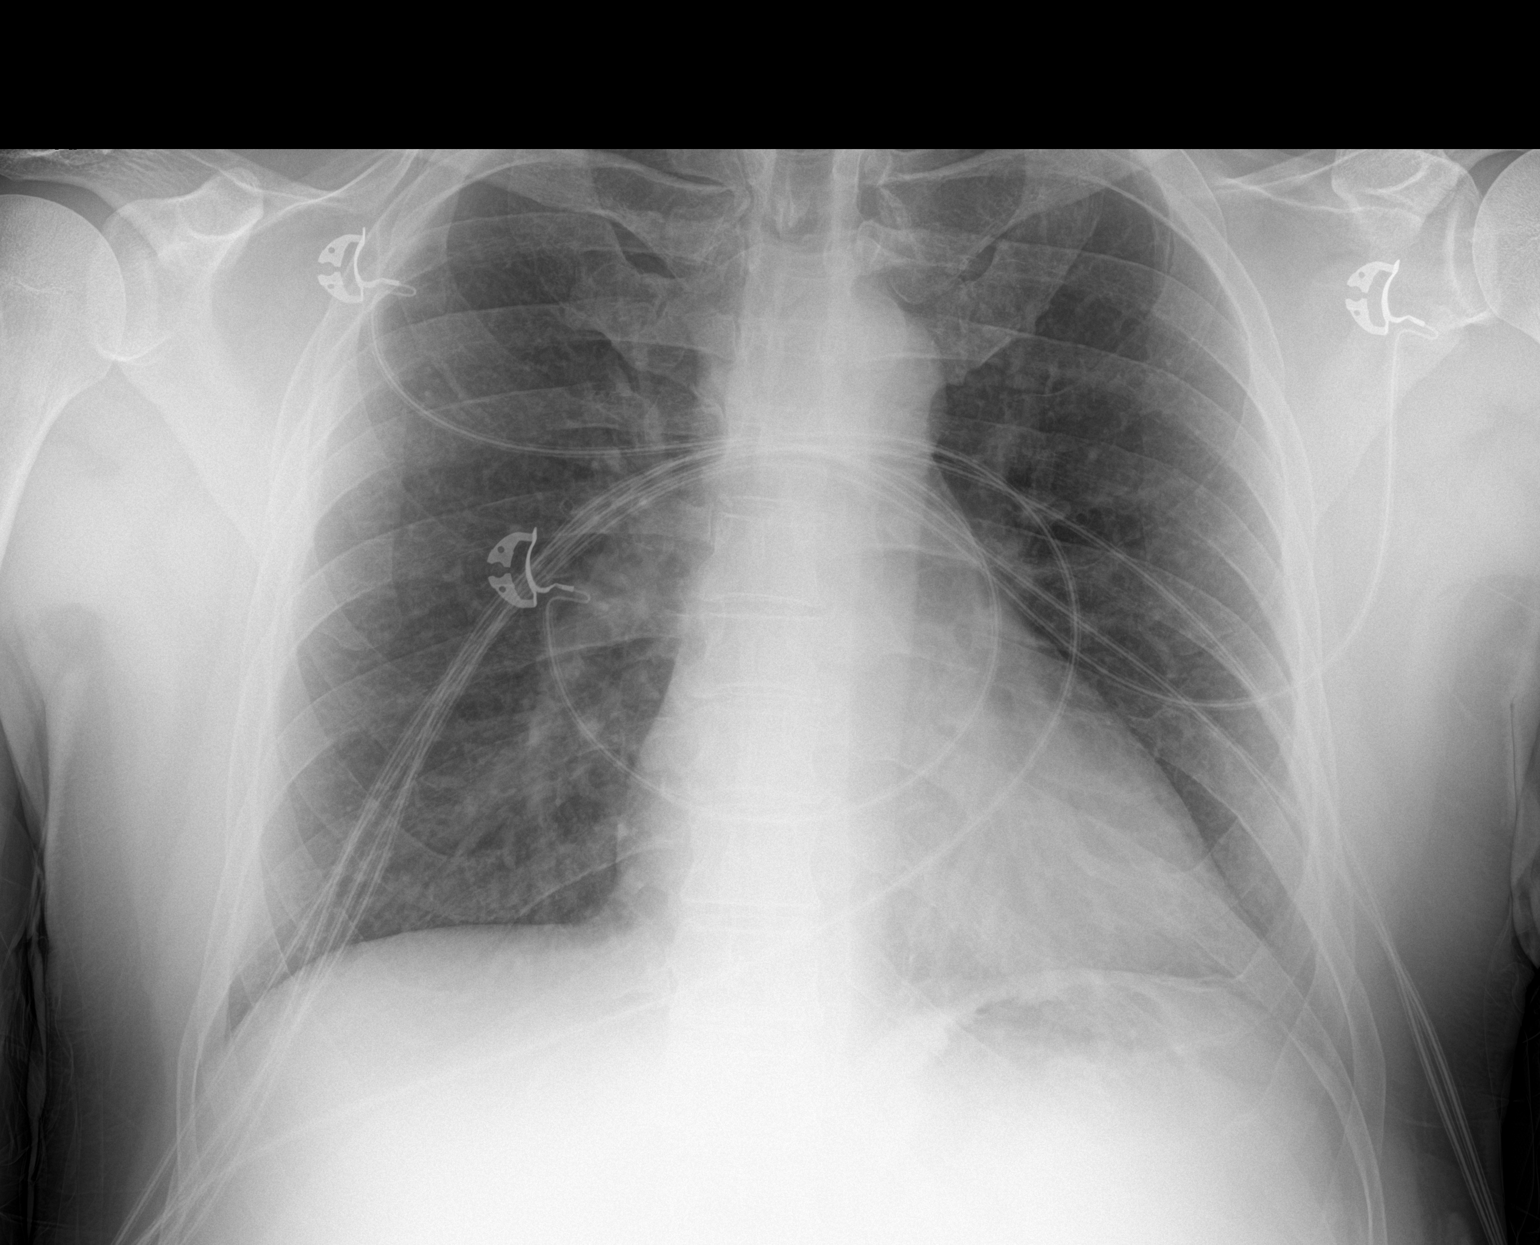

[1 of 1 positions shown; findings below may reference images not displayed]

FINDINGS: Heart size and mediastinal contours are within normal limits. No
suspicious pulmonary opacities identified.

No pleural effusion or pneumothorax visualized.

No acute osseous abnormality appreciated.
IMPRESSION: No acute intrathoracic process identified.
